# Patient Record
Sex: Male | Born: 1960 | Hispanic: Yes | Marital: Married | Smoking: Never smoker
Health system: Southern US, Community
[De-identification: ages and names within clinical notes are randomized; demographics above are authoritative.]

## PROBLEM LIST (undated history)

## (undated) DIAGNOSIS — G35 Multiple sclerosis: Secondary | ICD-10-CM

## (undated) DIAGNOSIS — E119 Type 2 diabetes mellitus without complications: Secondary | ICD-10-CM

## (undated) DIAGNOSIS — I10 Essential (primary) hypertension: Secondary | ICD-10-CM

---

## 2015-10-04 ENCOUNTER — Emergency Department: Payer: Medicare Other

## 2015-10-04 ENCOUNTER — Emergency Department
Admission: EM | Admit: 2015-10-04 | Discharge: 2015-10-04 | Disposition: A | Discharge status: Home / Self Care | Payer: Medicare Other | Attending: Emergency Medicine | Admitting: Emergency Medicine

## 2015-10-04 ENCOUNTER — Encounter: Payer: Self-pay | Admitting: Emergency Medicine

## 2015-10-04 DIAGNOSIS — M7581 Other shoulder lesions, right shoulder: Secondary | ICD-10-CM

## 2015-10-04 DIAGNOSIS — I10 Essential (primary) hypertension: Secondary | ICD-10-CM | POA: Insufficient documentation

## 2015-10-04 DIAGNOSIS — M25511 Pain in right shoulder: Secondary | ICD-10-CM

## 2015-10-04 DIAGNOSIS — M7591 Shoulder lesion, unspecified, right shoulder: Secondary | ICD-10-CM | POA: Insufficient documentation

## 2015-10-04 DIAGNOSIS — E119 Type 2 diabetes mellitus without complications: Secondary | ICD-10-CM | POA: Insufficient documentation

## 2015-10-04 DIAGNOSIS — Z7984 Long term (current) use of oral hypoglycemic drugs: Secondary | ICD-10-CM | POA: Insufficient documentation

## 2015-10-04 HISTORY — DX: Multiple sclerosis: G35

## 2015-10-04 HISTORY — DX: Type 2 diabetes mellitus without complications: E11.9

## 2015-10-04 HISTORY — DX: Essential (primary) hypertension: I10

## 2015-10-04 MED ORDER — HYDROCODONE-ACETAMINOPHEN 5-325 MG PO TABS: 1.0000 | ORAL_TABLET | ORAL | Status: AC | PRN

## 2015-10-04 MED ORDER — CYCLOBENZAPRINE HCL 5 MG PO TABS: 5.0000 mg | ORAL_TABLET | Freq: Three times a day (TID) | ORAL | Status: AC | PRN

## 2015-10-04 MED ORDER — KETOROLAC TROMETHAMINE 60 MG/2ML IM SOLN
60.0000 mg | Freq: Once | INTRAMUSCULAR | Status: AC
  Administered 2015-10-04: 60 mg via INTRAMUSCULAR
  Filled 2015-10-04: qty 2

## 2015-10-04 MED ORDER — IBUPROFEN 800 MG PO TABS: 800.0000 mg | ORAL_TABLET | Freq: Three times a day (TID) | ORAL | Status: AC | PRN

## 2015-10-04 NOTE — ED Notes (Signed)
Right shoulder pain since Friday  Denies any injury  Min swelling noted to shoulder and had some fever yesterday  Full ROM noted

## 2015-10-04 NOTE — Discharge Instructions (Signed)
Terapia con calor (Heat Therapy) La terapia con calor puede ayudar a aliviar articulaciones y msculos doloridos, lesionados, tensos y rgidos. El calor Mirantrelaja los msculos, lo cual puede ayudar a Engineer, materialsaliviar el dolor. La terapia con calor solo se debe usar en lesiones viejas, preexistentes o de larga duracin (crnicas). No use la terapia con calor a menos que se lo haya indicado el mdico. CMO USAR LA TERAPIA CON CALOR Existen varios tipos distintos de terapia con calor, como:  Compresas hmedas calientes.  Bao de agua caliente.  Bolsa de agua caliente.  [place] trmica.  Bolsa de gel caliente.  Vendaje caliente.  [place] trmica. RECOMENDACIONES GENERALES PARA LA TERAPIA CON CALOR   No duerma mientras [place] la terapia con calor. Utilice la terapia con calor solo mientras est despierto.  La piel puede volverse rosada mientras [place] la terapia con calor. No use la terapia con calor si la piel se pone roja.  No use la terapia con calor si siente un dolor nuevo.  Una temperatura muy alta o una exposicin prolongada al calor puede causar quemaduras. Sea cauto con la terapia de calor para evitar quemar la piel.  No use la terapia con calor en zonas de la piel que ya estn irritadas, como con una erupcin o una quemadura de sol. SOLICITE AYUDA SI:   Observa ampollas, enrojecimiento, hinchazn o adormecimiento.  Siente un dolor nuevo.  El dolor [place]empeora. ASEGRESE DE QUE:  Comprende estas instrucciones.  Controlar su afeccin.  Recibir ayuda de inmediato si no mejora o si empeora.   Esta informacin no tiene Theme park managercomo fin reemplazar el consejo del mdico. Asegrese de hacerle al mdico cualquier pregunta que tenga.   Document Released: 12/11/2011 Document Revised: 10/09/2014 Elsevier Interactive Patient Education 2016 [employer]  Crioterapia  (Cryotherapy)  La crioterapia consiste en aplicar hielo en una lesin. El hielo ayuda a Teacher, early years/predisminuir el dolor y la hinchazn  despus de una lesin. Hace ms efecto cuando si se comienza a usar en las primeras 24 a 48 horas.  CUIDADOS EN EL HOGAR   Ponga una toalla seca o hmeda entre el hielo y la piel.  Puede presionar suavemente sobre el hielo.  Deje el hielo no ms de 10 a 20 minutos a una hora.  Revise la piel despus de 5 minutos para asegurarse de que est bien.  Descanse al menos 20 minutos entre las aplicaciones de hielo.  Suspenda el uso si la piel pierde la sensibilidad (adormecimiento).  No use hielo en alguien que no pueda decir cuando le duele. Aqu se incluye a los nios pequeos y a las personas con problemas de memoria (demencia). SOLICITE AYUDA DE INMEDIATO SI:   Tiene manchas blancas en la piel.  La piel est azul o plida.  Siente que la piel est dura o similar a la cera.  La hinchazn empeora. ASEGRESE DE QUE:   Comprende estas instrucciones.  Controlar su enfermedad.  Solicitar ayuda de inmediato si no mejora o si empeora.   Esta informacin no tiene Theme park managercomo fin reemplazar el consejo del mdico. Asegrese de hacerle al mdico cualquier pregunta que tenga.   Document Released: 09/07/2011 Document Revised: 12/11/2011 Elsevier Interactive Patient Education 2016 [employer]  GoogleDolor en las articulaciones (Joint Pain) El dolor en las articulaciones, que tambin se conoce como artralgia, puede tener muchas causas. Suele desaparecer cuando se siguen las indicaciones del mdico en lo que respecta a Engineer, materialsaliviar el dolor en la casa. Sin embargo, tambin puede deberse a enfermedades que requieren otros tratamientos.  Las causas comunes del dolor en las articulaciones incluyen lo siguiente:  Hematomas en la zona de la articulacin.  Uso excesivo de Nurse, learning disability.  Desgaste normal de las articulaciones que ocurre con la edad (artrosis).  Otras formas diferentes de artritis.  La acumulacin de cristales de cido rico en la articulacin (gota).  Infecciones de la articulacin  (artritis sptica) o del hueso (osteomielitis). El mdico puede recomendar medicamentos para Engineer, materials. Si el dolor en las articulaciones persiste, tal vez haya que realizar ms estudios para Scientist, forensic. INSTRUCCIONES PARA EL CUIDADO EN EL HOGAR Controle su afeccin para ver si hay cambios. Siga estas instrucciones como se lo hayan indicado para aliviar el dolor que est sintiendo.  Tome los medicamentos solamente como se lo haya indicado el mdico.  Mantenga en reposo la zona afectada durante el tiempo que el mdico le haya indicado. Si se lo indican, levante la articulacin dolorida por encima del nivel del corazn cuando est sentado o acostado.  No haga cosas que le causen dolor o que lo intensifiquen.  Si se lo indican, aplique hielo sobre la zona dolorida:  Ponga el hielo en una bolsa plstica.  Coloque una toalla entre la piel y la bolsa de hielo.  Coloque el hielo durante , 2 a 3veces por Futures trader.  Use una venda elstica, una frula o un cabestrillo como se lo haya indicado el mdico. Afloje la venda elstica o la frula si los dedos de las manos o de los pies se le adormecen, siente hormigueo o se le enfran y se tornan de Research officer, trade union.  Comience a hacer ejercicios o a elongar la zona afectada como se lo haya indicado el mdico. Consulte al mdico qu tipos de ejercicios son seguros para usted.  Concurra a todas las visitas de control como se lo haya indicado el mdico. Esto es importante. SOLICITE ATENCIN MDICA SI:  El dolor aumenta y los medicamentos no lo [place].  El dolor en la articulacin no mejora en el trmino de 3das.  El hematoma o la hinchazn aumentan.  Tiene fiebre.  Adelgaza 10 libras (4,5 kg) o ms, sin proponrselo. SOLICITE ATENCIN MDICA DE INMEDIATO SI:  No puede mover la articulacin.  Los dedos de las manos o de los pies se le adormecen o se le enfran y se tornan de Research officer, trade union.   Esta informacin no tiene Microbiologist el consejo del mdico. Asegrese de hacerle al mdico cualquier pregunta que tenga.   Document Released: 09/18/2005 Document Revised: 10/09/2014 Elsevier Interactive Patient Education [employer]

## 2015-10-04 NOTE — ED Provider Notes (Signed)
[facility] Emergency Department Provider Note  ____________________________________________  Time seen: Approximately 12:33 PM  I have reviewed the triage vital signs and the nursing notes.   HISTORY  Chief Complaint Shoulder Pain    HPI Alma FriendlyMarco Antonio Ashley Sims is a 55 y.o. male who has a past medical history of diabetes hypertension and multiple sclerosis  presents for evaluation of right shoulder pain 3 days. Denies any injury. Patient reports that the pain is in his joint and worsens with movement.   Past Medical History  Diagnosis Date  . Diabetes mellitus without complication (HCC)   . Hypertension   . MS (multiple sclerosis) (HCC)     There are no active problems to display for this patient.   History reviewed. No pertinent past surgical history.  Current Outpatient Rx  Name  Route  Sig  Dispense  Refill  . cyclobenzaprine (FLEXERIL) 5 MG tablet   Oral   Take 1 tablet (5 mg total) by mouth every 8 (eight) hours as needed for muscle spasms.   30 tablet   0   . HYDROcodone-acetaminophen (NORCO) 5-325 MG tablet   Oral   Take 1-2 tablets by mouth every 4 (four) hours as needed for moderate pain.   15 tablet   0   . ibuprofen (ADVIL,MOTRIN) 800 MG tablet   Oral   Take 1 tablet (800 mg total) by mouth every 8 (eight) hours as needed.   30 tablet   0   . metFORMIN (GLUCOPHAGE) 500 MG tablet   Oral   Take 500 mg by mouth 2 (two) times daily with a meal.           Allergies Review of patient's allergies indicates no known allergies.  No family history on file.  Social History Social History  Substance Use Topics  . Smoking status: Never Smoker   . Smokeless tobacco: None  . Alcohol Use: No    Review of Systems Constitutional: No fever/chills Eyes: No visual changes. ENT: No sore throat. Cardiovascular: Denies chest pain. Respiratory: Denies shortness of breath. Gastrointestinal: No abdominal pain.  No nausea, no vomiting.   No diarrhea.  No constipation. Genitourinary: Negative for dysuria. Musculoskeletal: Positive for right shoulder pain. Skin: Negative for rash. Neurological: Negative for headaches, focal weakness or numbness.  10-point ROS otherwise negative.  ____________________________________________   PHYSICAL EXAM:  VITAL SIGNS: ED Triage Vitals  Enc Vitals Group     BP 10/04/15 1210 128/98 mmHg     Pulse Rate 10/04/15 1210 80     Resp 10/04/15 1210 16     Temp 10/04/15 1210 98.7 F (37.1 C)     Temp Source 10/04/15 1210 Oral     SpO2 10/04/15 1210 96 %     Weight 10/04/15 1210 190 lb (86.183 kg)     Height 10/04/15 1210 5\' 6"  (1.676 m)     Head Cir --      Peak Flow --      Pain Score 10/04/15 1211 8     Pain Loc --      Pain Edu? --      Excl. in GC? --     Constitutional: Alert and oriented. Well appearing and in no acute distress. Neck: No stridor.   Cardiovascular: Normal rate, regular rhythm. Grossly normal heart sounds.  Good peripheral circulation. Respiratory: Normal respiratory effort.  No retractions. Lungs CTAB. Musculoskeletal: No lower extremity tenderness nor edema.  No joint effusions. Full range of motion however increased tenderness  with extension, abduction Neurologic:  Normal speech and language. No gross focal neurologic deficits are appreciated. No gait instability. Skin:  Skin is warm, dry and intact. No rash noted. Psychiatric: Mood and affect are normal. Speech and behavior are normal.  ____________________________________________   LABS (all labs ordered are listed, but only abnormal results are displayed)  Labs Reviewed - No data to display ____________________________________________  RADIOLOGY  No acute osseous findings, bone spurs noted within the shoulder joint. ____________________________________________   PROCEDURES  Procedure(s) performed: None  Critical Care performed:  No  ____________________________________________   INITIAL IMPRESSION / ASSESSMENT AND PLAN / ED COURSE  Pertinent labs & imaging results that were available during my care of the patient were reviewed by me and considered in my medical decision making (see chart for details).  Bone spurs right shoulder. Rx given for Motrin 800 mg 3 times a day, Claritin 5/325. Encouraged to use a sling take the shoulder. Patient follow up with orthopedics on call for further evaluation. ____________________________________________   FINAL CLINICAL IMPRESSION(S) / ED DIAGNOSES  Final diagnoses:  Shoulder pain, acute, right  AC (acromioclavicular) joint bone spurs, right      Daniel Dakin, PA-C 10/04/15 1335  Daniel Creamer, MD 10/04/15 1553

## 2015-10-04 NOTE — ED Notes (Signed)
Pt reports right shoulder pain since Friday, denies any injury. Pt reports the pain as "joint pain". Pt with distal sensation intact.

## 2017-06-10 IMAGING — CR DG SHOULDER 2+V*R*
3 series · 3 of 3 positions shown · non-contrast
Comparison: None.

CLINICAL DATA: Right shoulder pain for 3 days

EXAM:
RIGHT SHOULDER - 2+ VIEW

[shoulder grashey]
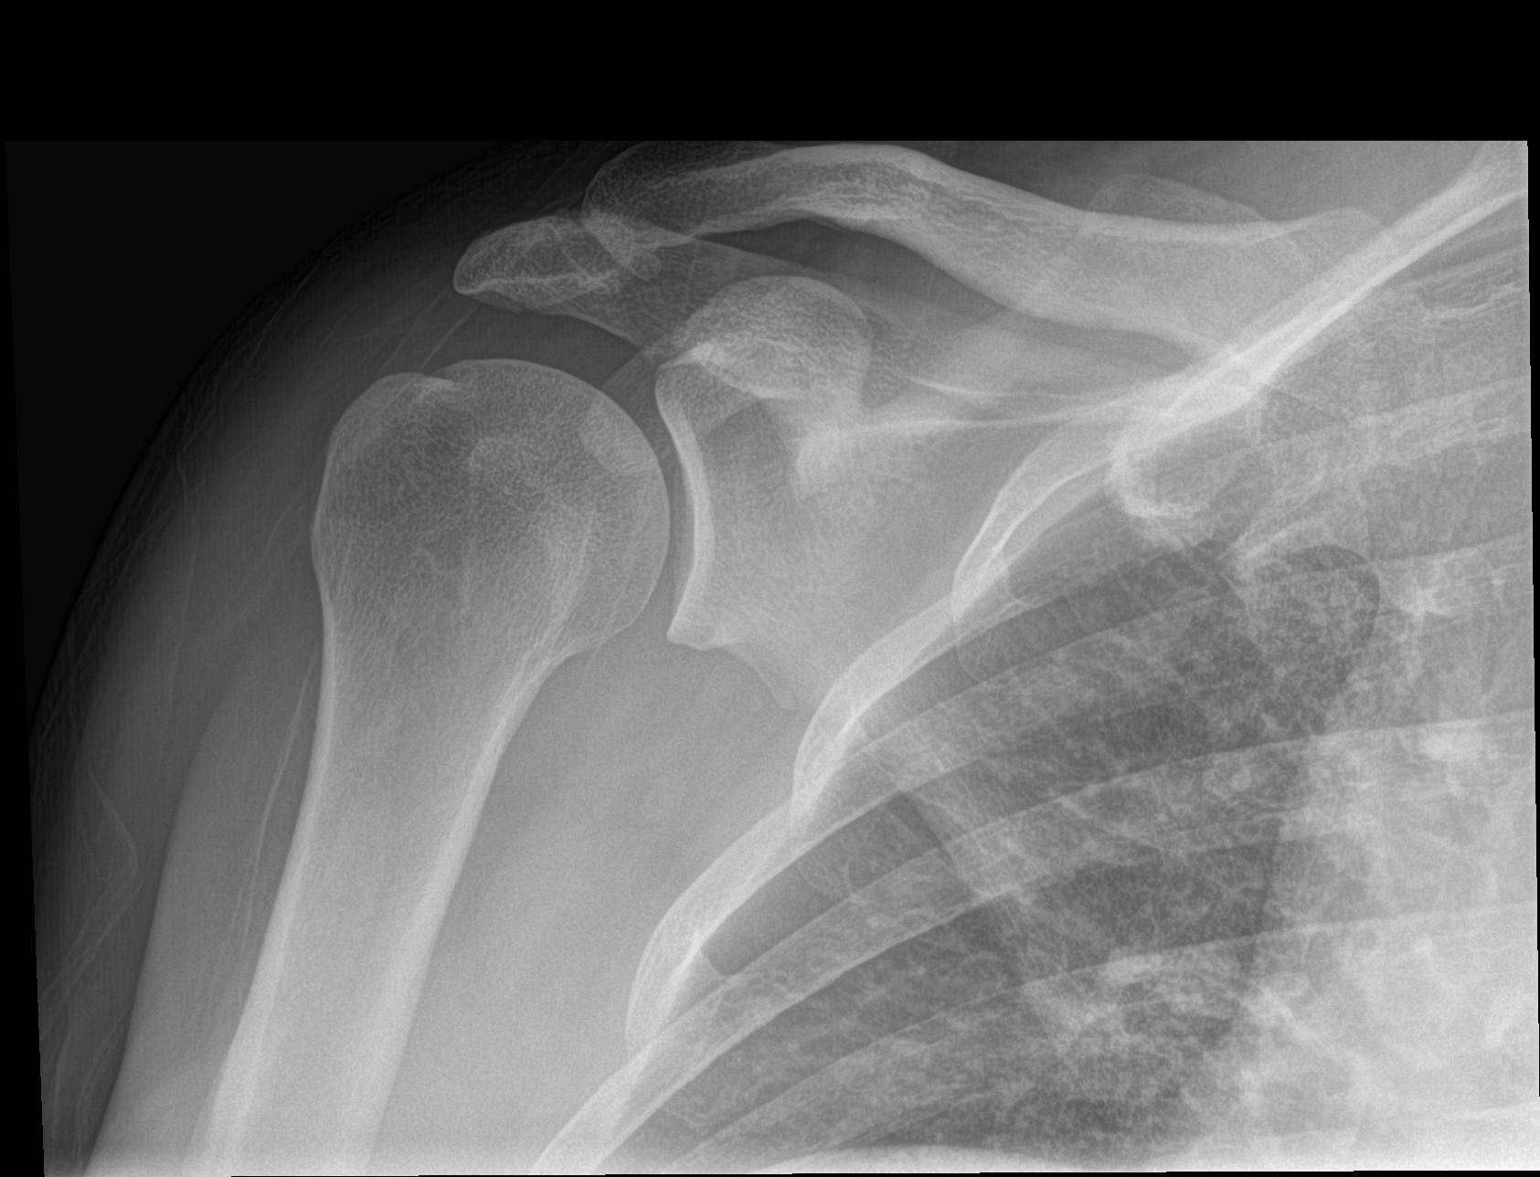

[shoulder y view]
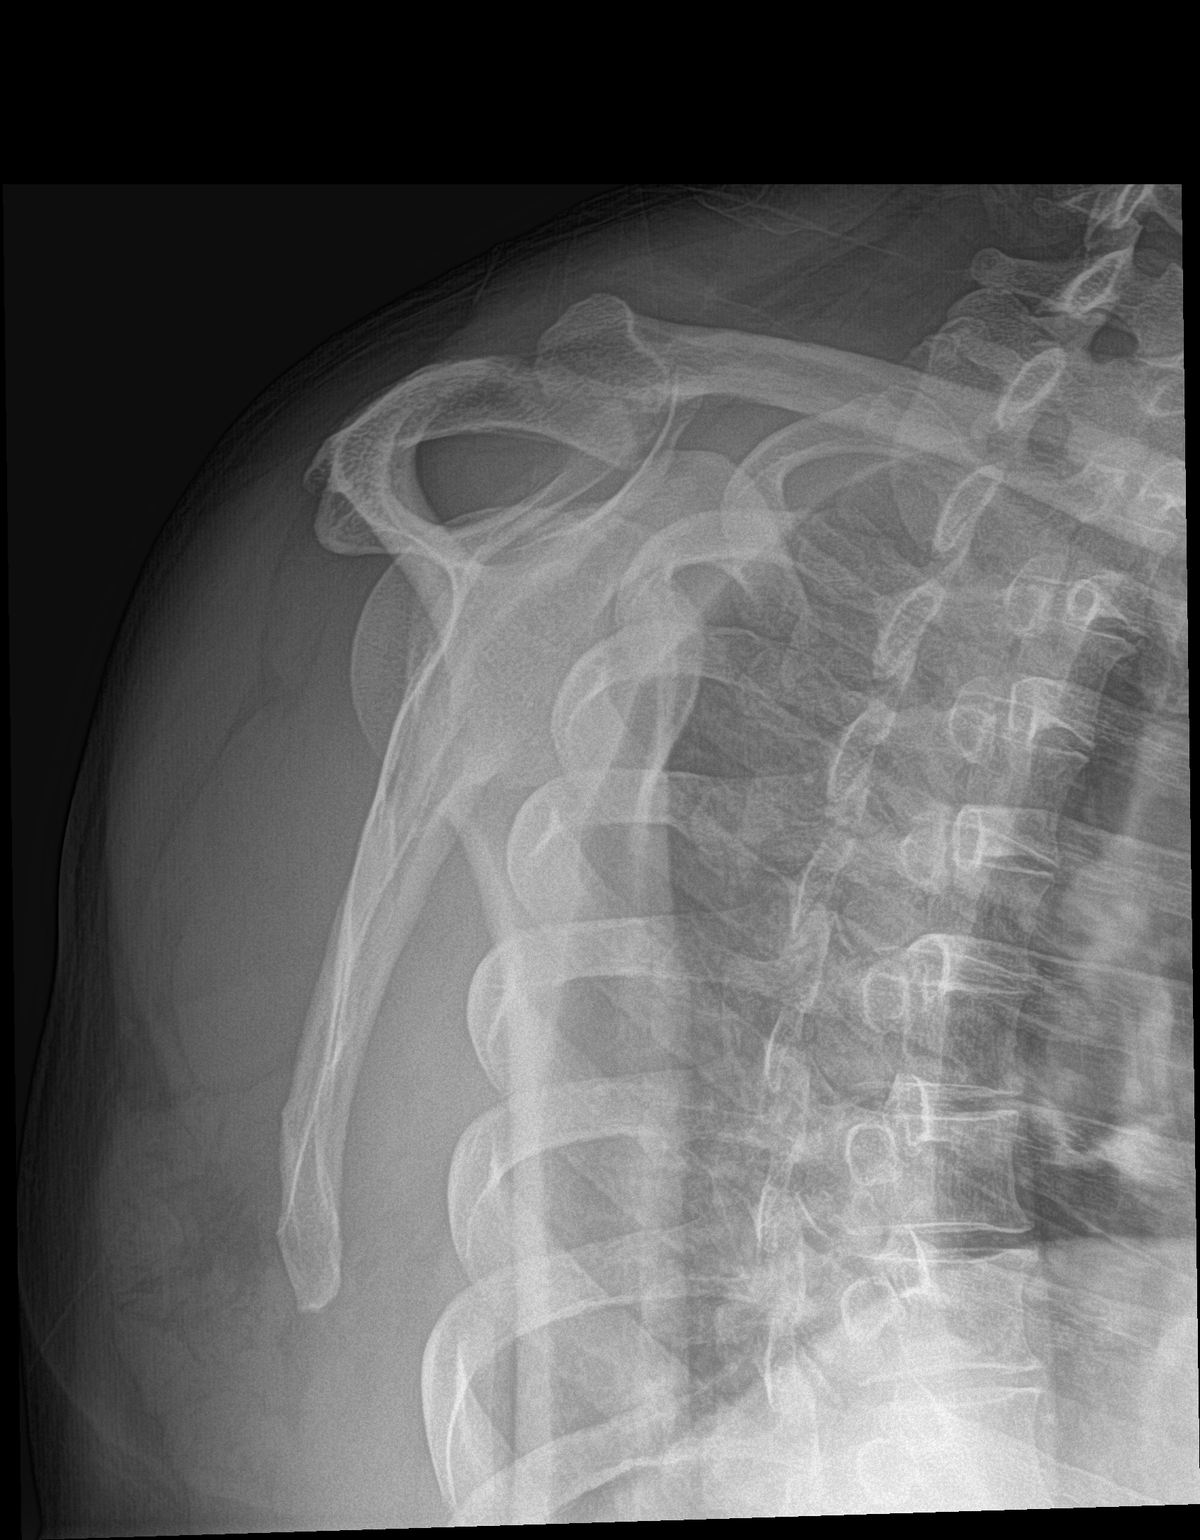

[shoulder axillary]
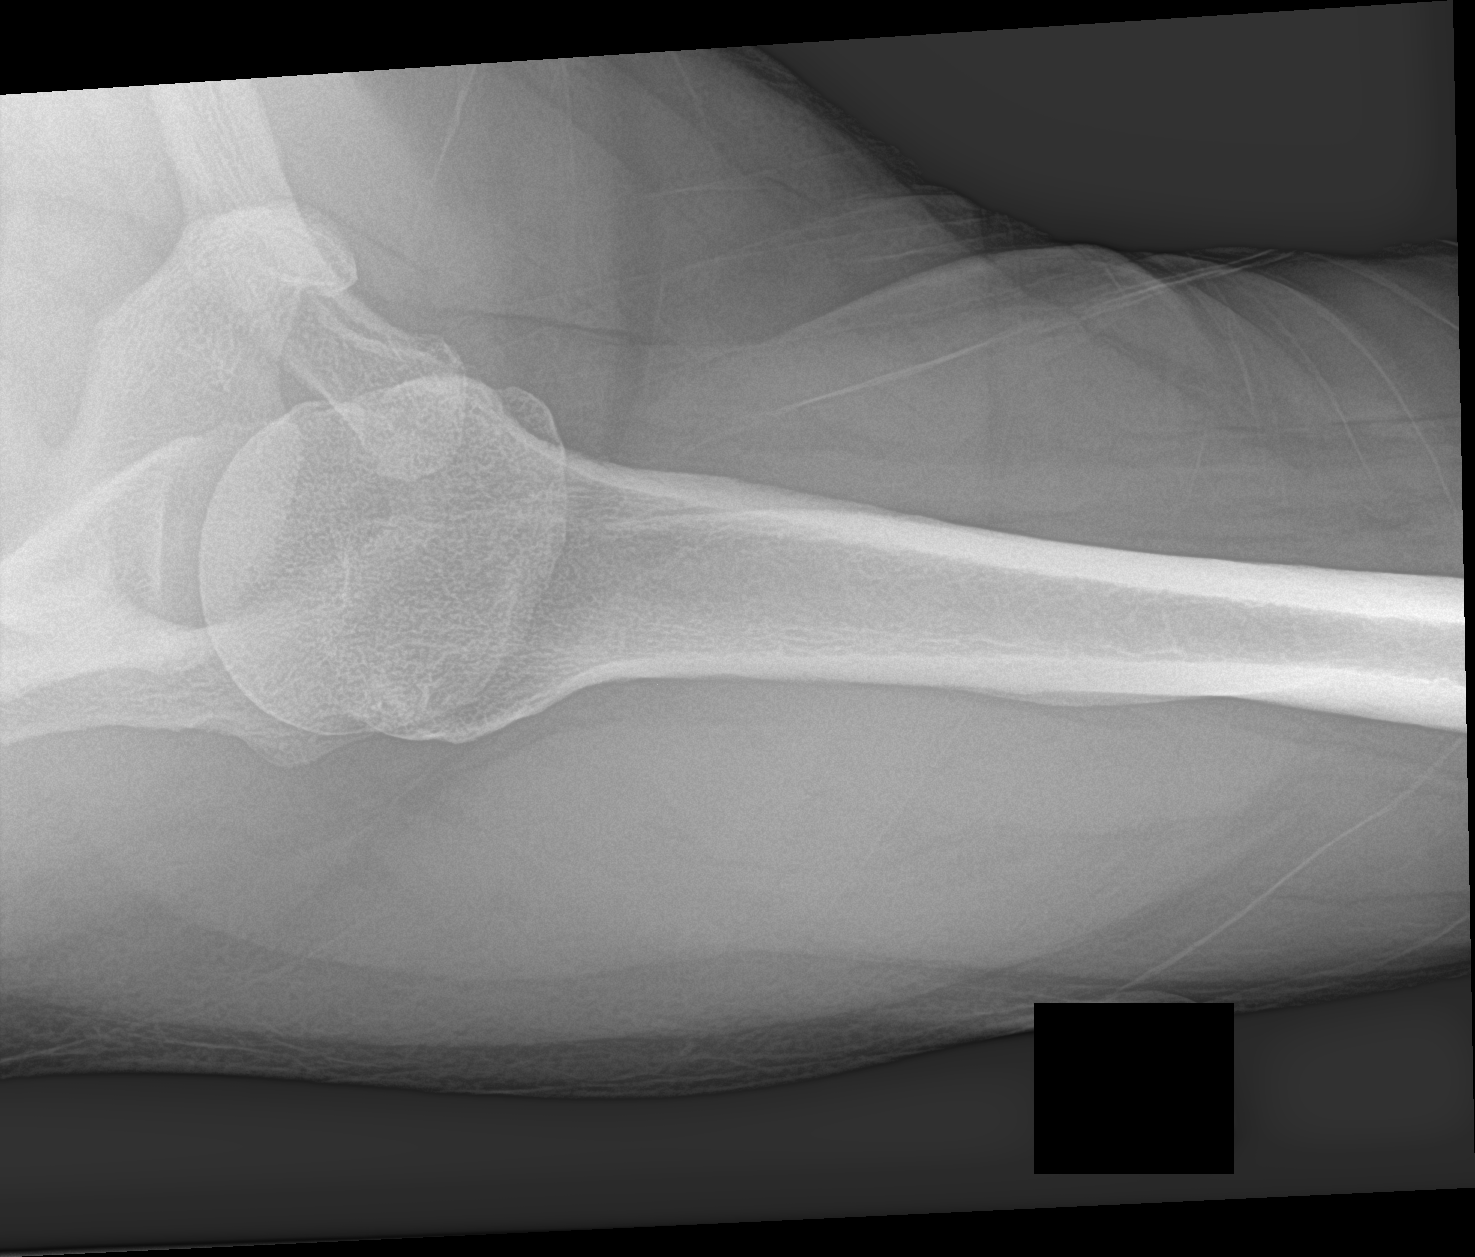

[3 of 3 positions shown; findings below may reference images not displayed]

FINDINGS: No acute fracture. No dislocation. Mild inferior osteophytes at the
AC joint.
IMPRESSION: No acute bony pathology.
